# Patient Record
Sex: Male | Born: 1990 | Hispanic: Yes | Marital: Single | Smoking: Never smoker
Health system: Southern US, Community
[De-identification: ages and names within clinical notes are randomized; demographics above are authoritative.]

---

## 2016-11-09 ENCOUNTER — Emergency Department (HOSPITAL_COMMUNITY)
Admission: EM | Admit: 2016-11-09 | Discharge: 2016-11-09 | Disposition: A | Discharge status: Home / Self Care | Payer: Self-pay | Attending: Emergency Medicine | Admitting: Emergency Medicine

## 2016-11-09 ENCOUNTER — Encounter (HOSPITAL_COMMUNITY): Payer: Self-pay

## 2016-11-09 ENCOUNTER — Emergency Department (HOSPITAL_COMMUNITY): Payer: Self-pay

## 2016-11-09 DIAGNOSIS — Y999 Unspecified external cause status: Secondary | ICD-10-CM | POA: Insufficient documentation

## 2016-11-09 DIAGNOSIS — Y92410 Unspecified street and highway as the place of occurrence of the external cause: Secondary | ICD-10-CM | POA: Insufficient documentation

## 2016-11-09 DIAGNOSIS — S0003XA Contusion of scalp, initial encounter: Secondary | ICD-10-CM | POA: Insufficient documentation

## 2016-11-09 DIAGNOSIS — S20312A Abrasion of left front wall of thorax, initial encounter: Secondary | ICD-10-CM | POA: Insufficient documentation

## 2016-11-09 DIAGNOSIS — Y939 Activity, unspecified: Secondary | ICD-10-CM | POA: Insufficient documentation

## 2016-11-09 MED ORDER — ACETAMINOPHEN 325 MG PO TABS
650.0000 mg | ORAL_TABLET | Freq: Once | ORAL | Status: DC
  Filled 2016-11-09: qty 2

## 2016-11-09 MED ORDER — IBUPROFEN 600 MG PO TABS: 600.0000 mg | ORAL_TABLET | Freq: Four times a day (QID) | ORAL | 0 refills | Status: AC | PRN

## 2016-11-09 MED ORDER — METHOCARBAMOL 500 MG PO TABS: 500.0000 mg | ORAL_TABLET | Freq: Three times a day (TID) | ORAL | 0 refills | Status: AC | PRN
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
